# Patient Record
Sex: Female | Born: 1975 | Race: Black or African American | Hispanic: No | Marital: Single | Smoking: Never smoker
Health system: Southern US, Community
[De-identification: ages and names within clinical notes are randomized; demographics above are authoritative.]

## PROBLEM LIST (undated history)

## (undated) DIAGNOSIS — L309 Dermatitis, unspecified: Secondary | ICD-10-CM

## (undated) DIAGNOSIS — L509 Urticaria, unspecified: Secondary | ICD-10-CM

## (undated) HISTORY — DX: Dermatitis, unspecified: L30.9

## (undated) HISTORY — DX: Urticaria, unspecified: L50.9

---

## 2000-01-12 ENCOUNTER — Emergency Department (HOSPITAL_COMMUNITY): Admission: EM | Admit: 2000-01-12 | Discharge: 2000-01-12 | Payer: Self-pay | Admitting: Emergency Medicine

## 2000-06-24 ENCOUNTER — Ambulatory Visit (HOSPITAL_COMMUNITY): Admission: RE | Admit: 2000-06-24 | Discharge: 2000-06-24 | Payer: Self-pay | Admitting: *Deleted

## 2000-06-24 ENCOUNTER — Encounter: Payer: Self-pay | Admitting: *Deleted

## 2000-08-01 ENCOUNTER — Inpatient Hospital Stay (HOSPITAL_COMMUNITY): Admission: AD | Admit: 2000-08-01 | Discharge: 2000-08-01 | Payer: Self-pay | Admitting: *Deleted

## 2000-10-28 ENCOUNTER — Inpatient Hospital Stay (HOSPITAL_COMMUNITY): Admission: AD | Admit: 2000-10-28 | Discharge: 2000-10-30 | Payer: Self-pay | Admitting: *Deleted

## 2001-05-24 ENCOUNTER — Other Ambulatory Visit: Admission: RE | Admit: 2001-05-24 | Discharge: 2001-05-24 | Payer: Self-pay | Admitting: *Deleted

## 2003-02-01 ENCOUNTER — Other Ambulatory Visit: Admission: RE | Admit: 2003-02-01 | Discharge: 2003-02-01 | Payer: Self-pay | Admitting: Obstetrics and Gynecology

## 2004-03-03 ENCOUNTER — Other Ambulatory Visit: Admission: RE | Admit: 2004-03-03 | Discharge: 2004-03-03 | Payer: Self-pay | Admitting: Obstetrics and Gynecology

## 2005-03-08 ENCOUNTER — Other Ambulatory Visit: Admission: RE | Admit: 2005-03-08 | Discharge: 2005-03-08 | Payer: Self-pay | Admitting: Obstetrics and Gynecology

## 2006-03-14 ENCOUNTER — Other Ambulatory Visit: Admission: RE | Admit: 2006-03-14 | Discharge: 2006-03-14 | Payer: Self-pay | Admitting: Obstetrics and Gynecology

## 2006-12-14 ENCOUNTER — Inpatient Hospital Stay (HOSPITAL_COMMUNITY): Admission: AD | Admit: 2006-12-14 | Discharge: 2006-12-16 | Payer: Self-pay | Admitting: Obstetrics and Gynecology

## 2006-12-20 HISTORY — PX: TUBAL LIGATION: SHX77

## 2007-01-23 ENCOUNTER — Other Ambulatory Visit: Admission: RE | Admit: 2007-01-23 | Discharge: 2007-01-23 | Payer: Self-pay | Admitting: Obstetrics and Gynecology

## 2007-02-06 ENCOUNTER — Ambulatory Visit (HOSPITAL_COMMUNITY): Admission: RE | Admit: 2007-02-06 | Discharge: 2007-02-06 | Payer: Self-pay | Admitting: Obstetrics and Gynecology

## 2013-05-23 ENCOUNTER — Ambulatory Visit: Payer: Self-pay | Admitting: Cardiovascular Disease

## 2016-04-23 ENCOUNTER — Ambulatory Visit (INDEPENDENT_AMBULATORY_CARE_PROVIDER_SITE_OTHER): Payer: BLUE CROSS/BLUE SHIELD | Admitting: Internal Medicine

## 2016-04-23 ENCOUNTER — Encounter: Payer: Self-pay | Admitting: Internal Medicine

## 2016-04-23 VITALS — BP 94/64 | HR 72 | Temp 99.0°F | Resp 16 | Ht 66.93 in | Wt 120.4 lb

## 2016-04-23 DIAGNOSIS — L509 Urticaria, unspecified: Secondary | ICD-10-CM | POA: Diagnosis not present

## 2016-04-23 NOTE — Patient Instructions (Signed)
Urticaria  Use products that do not contain any dyes or perfumes (free and clear products)  Continue cetirizine (Zyrtec) 10 mg daily. Take in the evening  Add Claritin (loratadine) 10 mg daily in the morning  Okay to stop Zantac if no improvement  Return for product allergy testing (true test). Please remember to stop antihistamines (Zyrtec, Claritin, Benadryl etc.) 3 days prior to test

## 2016-04-23 NOTE — Assessment & Plan Note (Signed)
   Use products that do not contain any dyes or perfumes (free and clear products)  Continue cetirizine (Zyrtec) 10 mg daily. Take in the evening  Add Claritin (loratadine) 10 mg daily in the morning  Okay to stop Zantac if no improvement  Return for product allergy testing (true test). Please remember to stop antihistamines (Zyrtec, Claritin, Benadryl etc.) 3 days prior to test

## 2016-04-23 NOTE — Progress Notes (Signed)
Referring provider: Wilburn Mylar, MD [phone] [place], [place] [address]  History of Present Illness:  Kristin Moss is a 40 y.o. female seen in consultation at the kind request of Dr. Tresa Endo for urticaria  HPI Comments: Hives: For the past 2 months, she has had intermittent symptoms that occur over her arms legs and back. She has not identified any triggers such as foods, medications, products, alcohol. There are no other affected family members. She does not have associated symptoms of respiratory, GI, cardiovascular compromise. She was prescribed cetirizine which she takes daily with partial improvement. She has tried Zantac also but this has not helped her.   Assessment and Plan: Urticaria  Use products that do not contain any dyes or perfumes (free and clear products)  Continue cetirizine (Zyrtec) 10 mg daily. Take in the evening  Add Claritin (loratadine) 10 mg daily in the morning  Okay to stop Zantac if no improvement  Return for product allergy testing (true test). Please remember to stop antihistamines (Zyrtec, Claritin, Benadryl etc.) 3 days prior to test    No Follow-up on file.  No current outpatient prescriptions on file prior to visit.   No current facility-administered medications on file prior to visit.    Meds ordered this encounter  Medications  . cetirizine (ZYRTEC) 10 MG tablet    Sig: Take 10 mg by mouth.  . ranitidine (ZANTAC) 300 MG tablet    Sig: Take 300 mg by mouth.  . DISCONTD: omeprazole (PRILOSEC) 40 MG capsule    Sig: Take 40 mg by mouth daily.  Marland Kitchen loratadine (CLARITIN) 10 MG tablet    Sig: Take 10 mg by mouth daily.    Diagnostics: Food allergy skin testing: Negative with a good histamine control  Skin tests were interpreted by me, transferred into EPIC by CMA, reviewed and accepted by me into EPIC.  Physical Exam: BP 94/64 mmHg  Pulse 72  Temp(Src) 99 F (37.2 C) (Oral)  Resp 16  Ht 5' 6.93" (1.7 m)  Wt 120 lb 5.9  oz (54.6 kg)  BMI 18.89 kg/m2   Physical Exam  Constitutional: She appears well-developed and well-nourished. No distress.  HENT:  Right Ear: External ear normal.  Left Ear: External ear normal.  Nose: Nose normal.  Mouth/Throat: Oropharynx is clear and moist.  Eyes: Conjunctivae are normal. Right eye exhibits no discharge. Left eye exhibits no discharge.  Cardiovascular: Normal rate, regular rhythm and normal heart sounds.   No murmur heard. Pulmonary/Chest: Effort normal and breath sounds normal. No respiratory distress. She has no wheezes. She has no rales.  Abdominal: Soft. Bowel sounds are normal.  Musculoskeletal: She exhibits no edema.  Lymphadenopathy:    She has no cervical adenopathy.  Neurological: She is alert.  Skin: Rash (2 erythematous patches over her back, no dermatographism noted) noted.  Vitals reviewed.   Review of systems: Per HPI unless specifically indicated below Review of Systems  Constitutional: Negative for fever, chills, appetite change and unexpected weight change.  HENT: Negative for congestion, ear pain, postnasal drip, rhinorrhea, sinus pressure, sneezing and sore throat.   Eyes: Negative for pain and itching.  Respiratory: Negative for cough, chest tightness and wheezing.   Cardiovascular: Negative for chest pain and leg swelling.  Gastrointestinal: Negative for vomiting and diarrhea.  Genitourinary: Negative for difficulty urinating.  Musculoskeletal: Negative for joint swelling and arthralgias.  Skin: Positive for rash.  Allergic/Immunologic: Negative for environmental allergies, food allergies and immunocompromised state.  Stung by bee - no problem No latex allergy  Neurological: Negative for seizures.    Patient Active Problem List   Diagnosis Date Noted  . Urticaria 04/23/2016    Past Surgical History  Procedure Laterality Date  . Tubal ligation  2008    Family History  Problem Relation Age of Onset  . Prostate cancer  Father   . Allergic rhinitis Son   . Eczema Daughter     Environmental/Social history: She lives in a house that is 39 years of age, there is carpet in the bedroom, there is central air conditioning and heating, there is a dog in the home, there is no basement in the home, there are no allergy covers over the pillow or mattress, she is a lifelong nonsmoker, she is an Chartered certified accountantoperations supervisor.  Drug Allergies:  No Known Allergies  Thank you for the opportunity to care for this patient.  Please do not hesitate to contact me with questions.

## 2016-04-26 ENCOUNTER — Ambulatory Visit: Payer: BLUE CROSS/BLUE SHIELD | Admitting: Internal Medicine

## 2016-04-26 NOTE — Addendum Note (Signed)
Addended by: Vincent PeyerKING, MICHELE A on: 04/26/2016 01:37 PM   Modules accepted: Orders

## 2016-04-26 NOTE — Progress Notes (Signed)
err

## 2016-04-28 ENCOUNTER — Encounter: Payer: BLUE CROSS/BLUE SHIELD | Admitting: Internal Medicine

## 2016-04-29 ENCOUNTER — Encounter: Payer: BLUE CROSS/BLUE SHIELD | Admitting: Internal Medicine

## 2016-04-30 ENCOUNTER — Encounter: Payer: BLUE CROSS/BLUE SHIELD | Admitting: Pediatrics

## 2016-05-03 NOTE — Addendum Note (Signed)
Addended byMikki Santee: Everest [employer] on: 05/03/2016 01:34 PM   Modules accepted: Orders

## 2016-05-03 NOTE — Progress Notes (Signed)
True test results: at 48 hours, 1+ reaction to budesonide and hydrocortisone. At 96 hours, 3+ reaction to budesonide and hydrocortisone. Handouts given.

## 2018-08-24 ENCOUNTER — Other Ambulatory Visit: Payer: Self-pay | Admitting: Obstetrics & Gynecology

## 2018-08-24 DIAGNOSIS — R928 Other abnormal and inconclusive findings on diagnostic imaging of breast: Secondary | ICD-10-CM

## 2018-08-28 ENCOUNTER — Other Ambulatory Visit: Payer: Self-pay | Admitting: Obstetrics & Gynecology

## 2018-08-28 ENCOUNTER — Ambulatory Visit
Admission: RE | Admit: 2018-08-28 | Discharge: 2018-08-28 | Disposition: A | Discharge status: Home / Self Care | Payer: BLUE CROSS/BLUE SHIELD | Source: Ambulatory Visit | Attending: Obstetrics & Gynecology | Admitting: Obstetrics & Gynecology

## 2018-08-28 DIAGNOSIS — R928 Other abnormal and inconclusive findings on diagnostic imaging of breast: Secondary | ICD-10-CM

## 2018-08-28 DIAGNOSIS — N63 Unspecified lump in unspecified breast: Secondary | ICD-10-CM

## 2019-02-26 ENCOUNTER — Other Ambulatory Visit: Payer: Self-pay | Admitting: Obstetrics

## 2019-02-26 DIAGNOSIS — N63 Unspecified lump in unspecified breast: Secondary | ICD-10-CM

## 2019-02-27 ENCOUNTER — Ambulatory Visit
Admission: RE | Admit: 2019-02-27 | Discharge: 2019-02-27 | Disposition: A | Discharge status: Home / Self Care | Payer: BLUE CROSS/BLUE SHIELD | Source: Ambulatory Visit | Attending: Obstetrics & Gynecology | Admitting: Obstetrics & Gynecology

## 2019-02-27 ENCOUNTER — Other Ambulatory Visit: Payer: Self-pay | Admitting: Obstetrics

## 2019-02-27 DIAGNOSIS — N63 Unspecified lump in unspecified breast: Secondary | ICD-10-CM

## 2019-08-31 ENCOUNTER — Ambulatory Visit
Admission: RE | Admit: 2019-08-31 | Discharge: 2019-08-31 | Disposition: A | Discharge status: Home / Self Care | Payer: 59 | Source: Ambulatory Visit | Attending: Obstetrics | Admitting: Obstetrics

## 2019-08-31 ENCOUNTER — Other Ambulatory Visit: Payer: Self-pay

## 2019-08-31 DIAGNOSIS — N63 Unspecified lump in unspecified breast: Secondary | ICD-10-CM

## 2021-01-19 ENCOUNTER — Other Ambulatory Visit: Payer: Self-pay | Admitting: Obstetrics and Gynecology

## 2021-01-19 DIAGNOSIS — R928 Other abnormal and inconclusive findings on diagnostic imaging of breast: Secondary | ICD-10-CM

## 2021-01-30 ENCOUNTER — Other Ambulatory Visit: Payer: Self-pay

## 2021-01-30 ENCOUNTER — Ambulatory Visit
Admission: RE | Admit: 2021-01-30 | Discharge: 2021-01-30 | Disposition: A | Discharge status: Home / Self Care | Payer: 59 | Source: Ambulatory Visit | Attending: Obstetrics and Gynecology | Admitting: Obstetrics and Gynecology

## 2021-01-30 DIAGNOSIS — R928 Other abnormal and inconclusive findings on diagnostic imaging of breast: Secondary | ICD-10-CM

## 2022-03-16 ENCOUNTER — Other Ambulatory Visit: Payer: Self-pay | Admitting: Obstetrics and Gynecology

## 2022-03-16 DIAGNOSIS — Z1231 Encounter for screening mammogram for malignant neoplasm of breast: Secondary | ICD-10-CM

## 2022-03-19 ENCOUNTER — Ambulatory Visit
Admission: RE | Admit: 2022-03-19 | Discharge: 2022-03-19 | Disposition: A | Discharge status: Home / Self Care | Payer: No Typology Code available for payment source | Source: Ambulatory Visit | Attending: Obstetrics and Gynecology | Admitting: Obstetrics and Gynecology

## 2022-03-19 DIAGNOSIS — Z1231 Encounter for screening mammogram for malignant neoplasm of breast: Secondary | ICD-10-CM

## 2023-02-14 ENCOUNTER — Other Ambulatory Visit: Payer: Self-pay | Admitting: Obstetrics and Gynecology

## 2023-02-14 DIAGNOSIS — Z1231 Encounter for screening mammogram for malignant neoplasm of breast: Secondary | ICD-10-CM

## 2023-04-01 ENCOUNTER — Ambulatory Visit
Admission: RE | Admit: 2023-04-01 | Discharge: 2023-04-01 | Disposition: A | Discharge status: Home / Self Care | Payer: No Typology Code available for payment source | Source: Ambulatory Visit | Attending: Obstetrics and Gynecology | Admitting: Obstetrics and Gynecology

## 2023-04-01 DIAGNOSIS — Z1231 Encounter for screening mammogram for malignant neoplasm of breast: Secondary | ICD-10-CM

## 2023-11-20 IMAGING — MG MM DIGITAL SCREENING BILAT W/ TOMO AND CAD
8 series · 8 of 24 positions shown · non-contrast
Comparison: Previous exam(s).

CLINICAL DATA: Screening. History of bilateral breast cysts.

EXAM:
DIGITAL SCREENING BILATERAL MAMMOGRAM WITH TOMOSYNTHESIS AND CAD
TECHNIQUE: Bilateral screening digital craniocaudal and mediolateral oblique
mammograms were obtained. Bilateral screening digital breast
tomosynthesis was performed. The images were evaluated with
computer-aided detection.

[L MLO synth-2D]
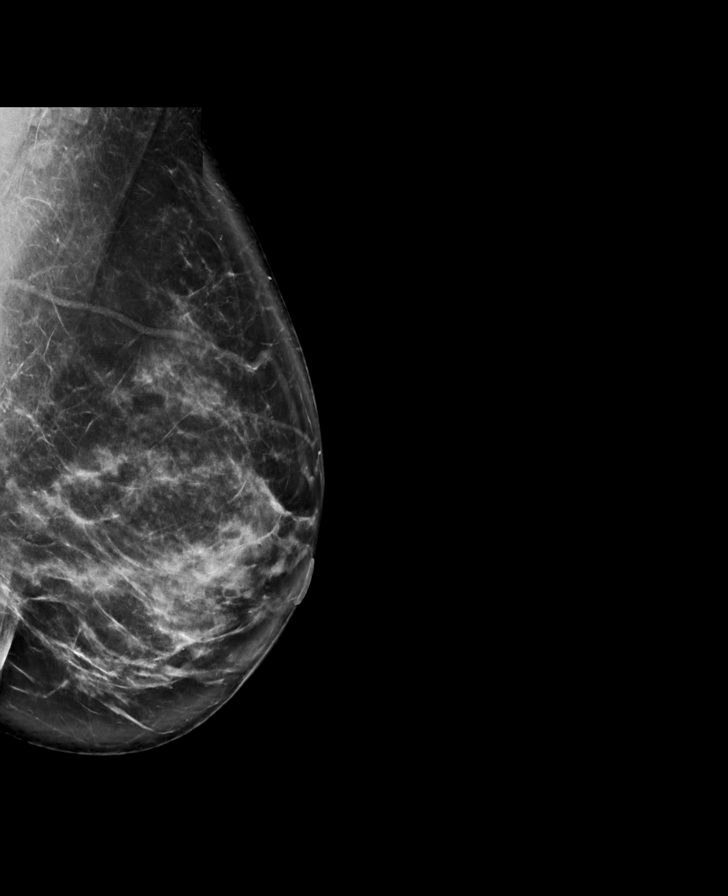

[L CC synth-2D]
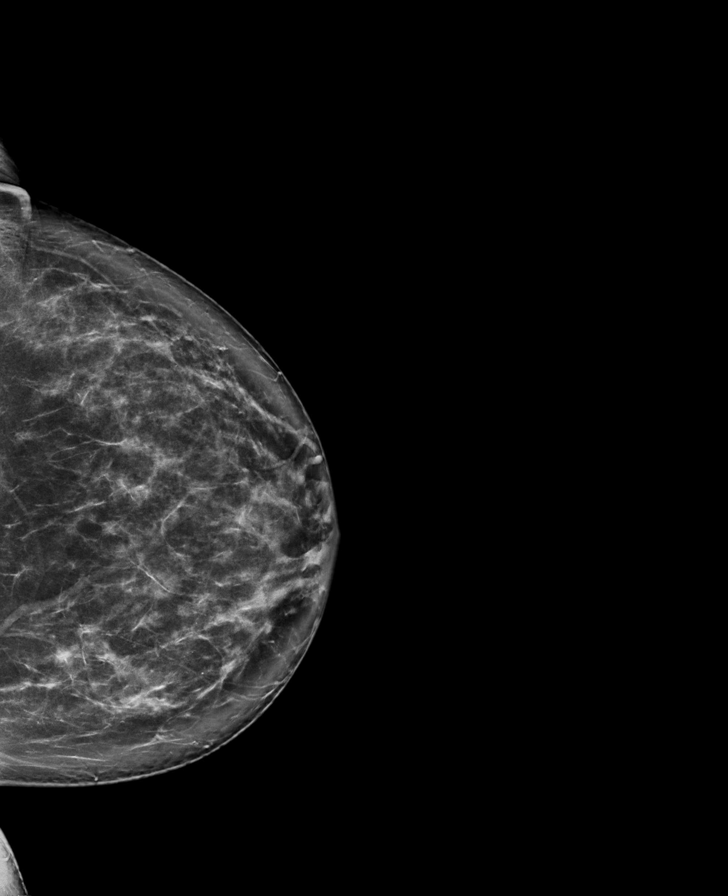

[R MLO synth-2D]
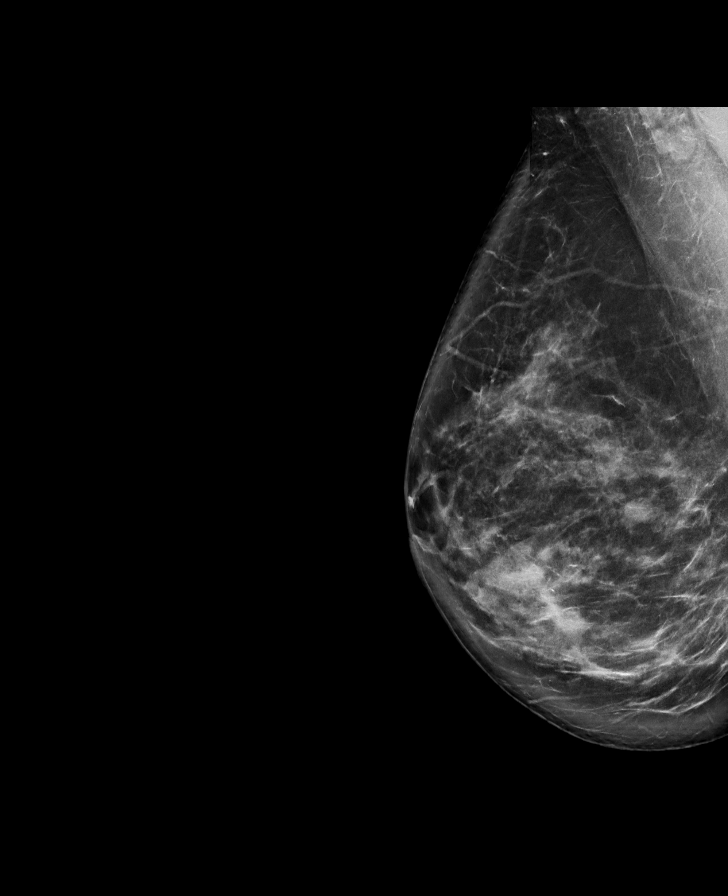

[R CC synth-2D]
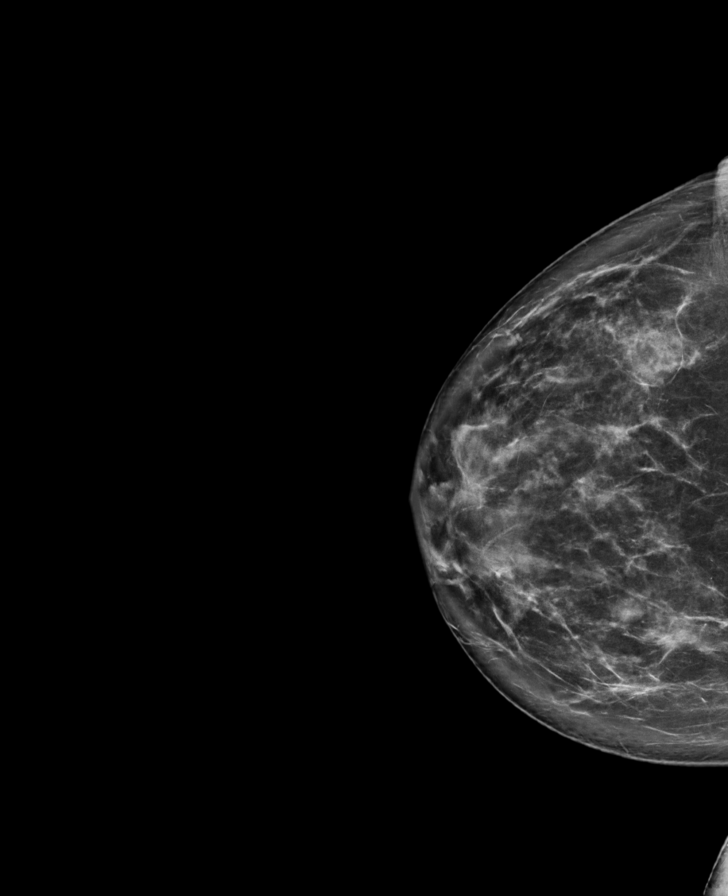

[R MLO tomo · tomo slice 49/97.0]
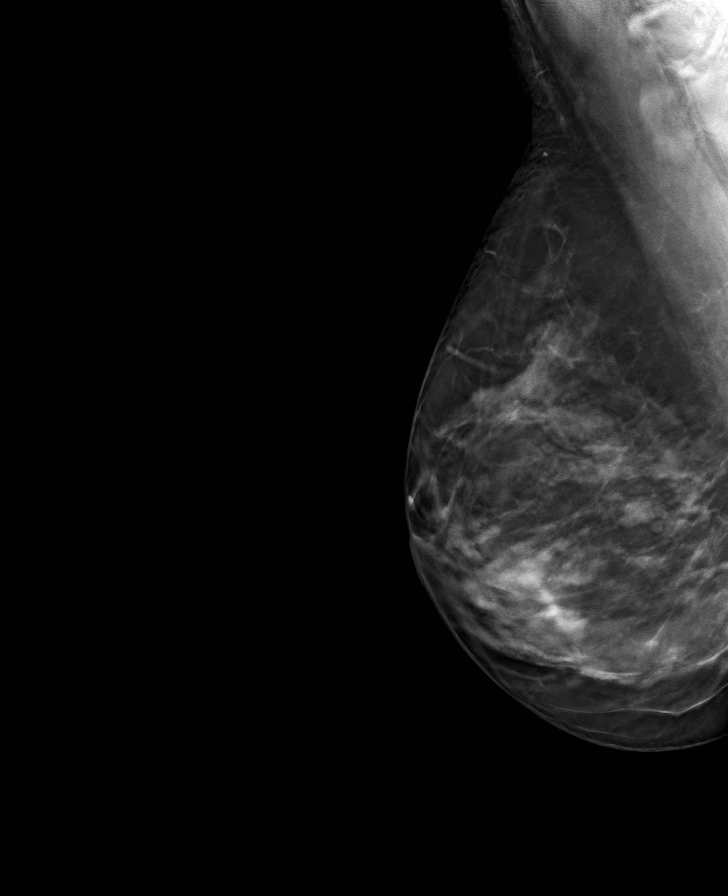

[L MLO tomo · tomo slice 50/99.0]
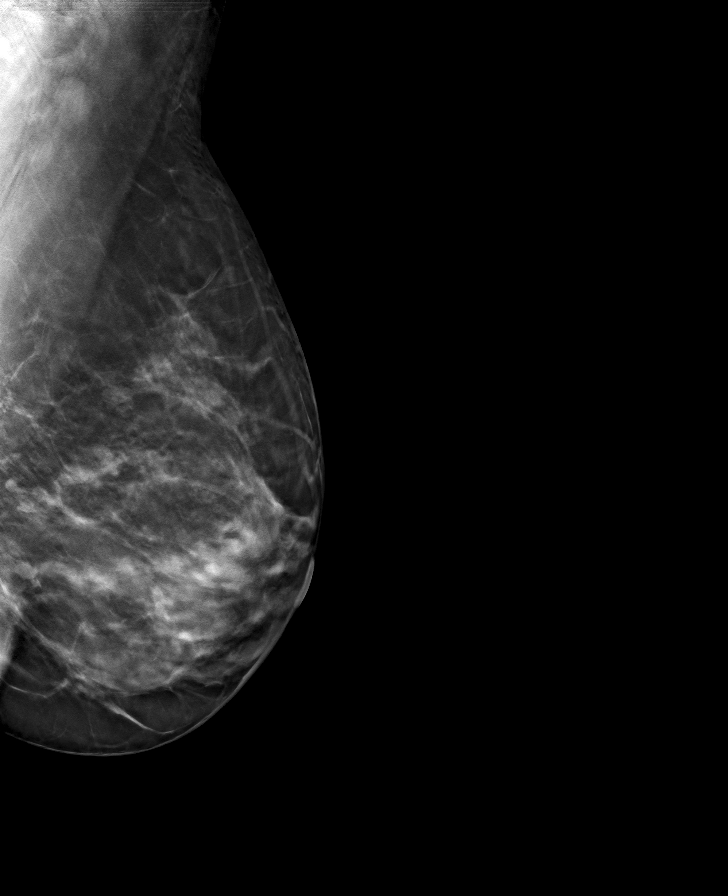

[L CC tomo · tomo slice 49/98.0]
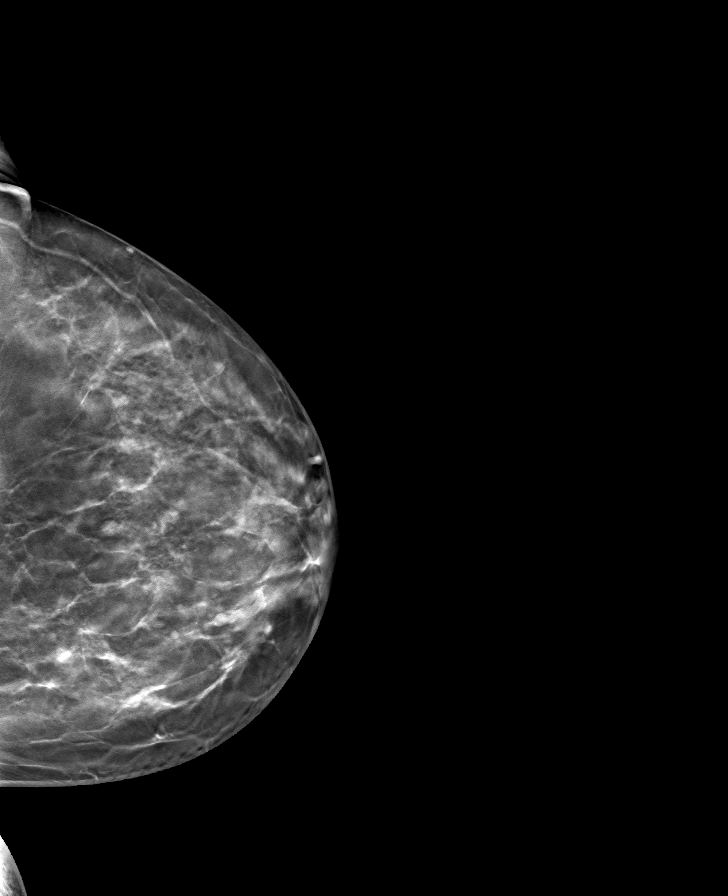

[R CC tomo · tomo slice 50/99.0]
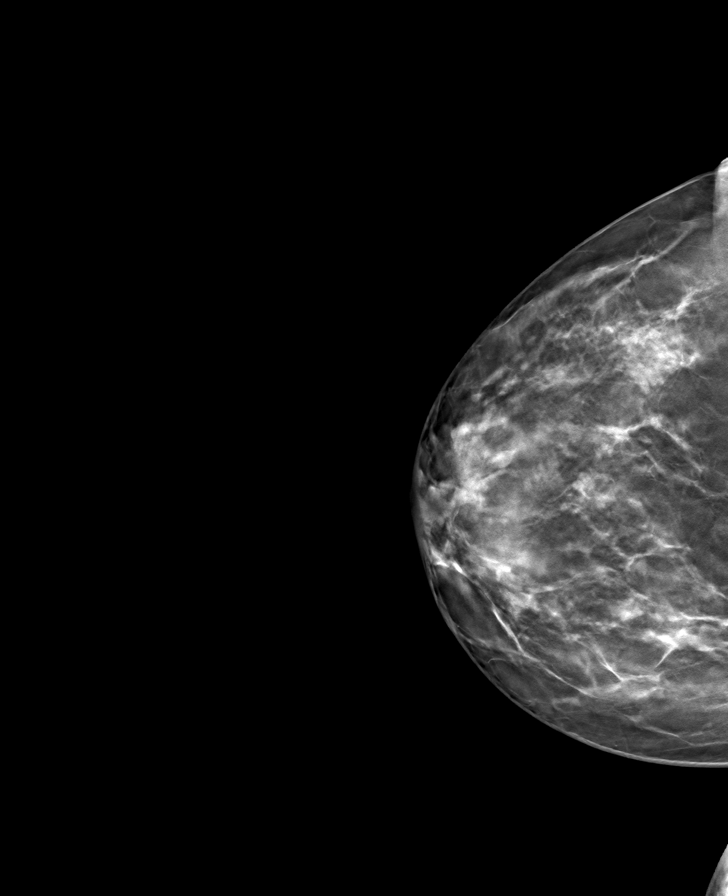

[8 of 24 positions shown; findings below may reference images not displayed]

ACR Breast Density Category c: The breast tissue is heterogeneously
dense, which may obscure small masses.
FINDINGS: There are no findings suspicious for malignancy.
IMPRESSION: No mammographic evidence of malignancy. A result letter of this
screening mammogram will be mailed directly to the patient.

RECOMMENDATION:
Screening mammogram in one year. (Code:B7-6-C8E)

BI-RADS CATEGORY  1: Negative.

## 2024-02-16 ENCOUNTER — Other Ambulatory Visit: Payer: Self-pay | Admitting: Obstetrics and Gynecology

## 2024-02-16 DIAGNOSIS — Z1231 Encounter for screening mammogram for malignant neoplasm of breast: Secondary | ICD-10-CM

## 2024-04-02 ENCOUNTER — Ambulatory Visit: Payer: No Typology Code available for payment source

## 2024-04-03 ENCOUNTER — Ambulatory Visit
Admission: RE | Admit: 2024-04-03 | Discharge: 2024-04-03 | Disposition: A | Discharge status: Home / Self Care | Source: Ambulatory Visit | Attending: Obstetrics and Gynecology | Admitting: Obstetrics and Gynecology

## 2024-04-03 DIAGNOSIS — Z1231 Encounter for screening mammogram for malignant neoplasm of breast: Secondary | ICD-10-CM

## 2024-04-06 ENCOUNTER — Other Ambulatory Visit: Payer: Self-pay | Admitting: Obstetrics and Gynecology

## 2024-04-06 DIAGNOSIS — R928 Other abnormal and inconclusive findings on diagnostic imaging of breast: Secondary | ICD-10-CM

## 2024-04-18 ENCOUNTER — Ambulatory Visit
Admission: RE | Admit: 2024-04-18 | Discharge: 2024-04-18 | Disposition: A | Discharge status: Home / Self Care | Source: Ambulatory Visit | Attending: Obstetrics and Gynecology | Admitting: Obstetrics and Gynecology

## 2024-04-18 DIAGNOSIS — R928 Other abnormal and inconclusive findings on diagnostic imaging of breast: Secondary | ICD-10-CM
